# Patient Record
Sex: Male | Born: 1980 | Race: White | Hispanic: No | Marital: Single | Smoking: Current every day smoker
Health system: Southern US, Community
[De-identification: ages and names within clinical notes are randomized; demographics above are authoritative.]

## PROBLEM LIST (undated history)

## (undated) DIAGNOSIS — M1990 Unspecified osteoarthritis, unspecified site: Secondary | ICD-10-CM

---

## 2010-09-25 ENCOUNTER — Emergency Department: Payer: Self-pay | Admitting: Emergency Medicine

## 2018-12-02 ENCOUNTER — Other Ambulatory Visit: Payer: Self-pay

## 2018-12-02 ENCOUNTER — Encounter (HOSPITAL_COMMUNITY): Payer: Self-pay | Admitting: Emergency Medicine

## 2018-12-02 ENCOUNTER — Emergency Department (HOSPITAL_COMMUNITY)
Admission: EM | Admit: 2018-12-02 | Discharge: 2018-12-02 | Disposition: A | Discharge status: Home / Self Care | Payer: 59 | Attending: Emergency Medicine | Admitting: Emergency Medicine

## 2018-12-02 DIAGNOSIS — R197 Diarrhea, unspecified: Secondary | ICD-10-CM | POA: Insufficient documentation

## 2018-12-02 DIAGNOSIS — F17210 Nicotine dependence, cigarettes, uncomplicated: Secondary | ICD-10-CM | POA: Insufficient documentation

## 2018-12-02 DIAGNOSIS — R6883 Chills (without fever): Secondary | ICD-10-CM | POA: Insufficient documentation

## 2018-12-02 DIAGNOSIS — R05 Cough: Secondary | ICD-10-CM | POA: Diagnosis not present

## 2018-12-02 DIAGNOSIS — R6889 Other general symptoms and signs: Secondary | ICD-10-CM

## 2018-12-02 HISTORY — DX: Unspecified osteoarthritis, unspecified site: M19.90

## 2018-12-02 LAB — INFLUENZA PANEL BY PCR (TYPE A & B)
Influenza A By PCR: POSITIVE — AB
Influenza B By PCR: NEGATIVE

## 2018-12-02 NOTE — Discharge Instructions (Addendum)
Follow up viral testing more so for your knowledge and if positive unlikely COVID. Take tylenol every 4 hrs for pain and fevers.   If flu negative follow below.    Person Under Monitoring Name: Edwin Tucker  Location: [address] [place] [address]   Infection Prevention Recommendations for Individuals Confirmed to have, or Being Evaluated for, 2019 Novel Coronavirus (COVID-19) Infection Who Receive Care at Home  Individuals who are confirmed to have, or are being evaluated for, COVID-19 should follow the prevention steps below until a healthcare provider or local or state health department says they can return to normal activities.  Stay home except to get medical care You should restrict activities outside your home, except for getting medical care. Do not go to work, school, or public areas, and do not use public transportation or taxis.  Call ahead before visiting your doctor Before your medical appointment, call the healthcare provider and tell them that you have, or are being evaluated for, COVID-19 infection. This will help the healthcare providers office take steps to keep other people from getting infected. Ask your healthcare provider to call the local or state health department.  Monitor your symptoms Seek prompt medical attention if your illness is worsening (e.g., difficulty breathing). Before going to your medical appointment, call the healthcare provider and tell them that you have, or are being evaluated for, COVID-19 infection. Ask your healthcare provider to call the local or state health department.  Wear a facemask You should wear a facemask that covers your nose and mouth when you are in the same room with other people and when you visit a healthcare provider. People who live with or visit you should also wear a facemask while they are in the same room with you.  Separate yourself from other people in your home As much as possible, you  should stay in a different room from other people in your home. Also, you should use a separate bathroom, if available.  Avoid sharing household items You should not share dishes, drinking glasses, cups, eating utensils, towels, bedding, or other items with other people in your home. After using these items, you should wash them thoroughly with soap and water.  Cover your coughs and sneezes Cover your mouth and nose with a tissue when you cough or sneeze, or you can cough or sneeze into your sleeve. Throw used tissues in a lined trash can, and immediately wash your hands with soap and water for at least 20 seconds or use an alcohol-based hand rub.  Wash your [employer] your hands often and thoroughly with soap and water for at least 20 seconds. You can use an alcohol-based hand sanitizer if soap and water are not available and if your hands are not visibly dirty. Avoid touching your eyes, nose, and mouth with unwashed hands.   Prevention Steps for Caregivers and Household Members of Individuals Confirmed to have, or Being Evaluated for, COVID-19 Infection Being Cared for in the Home  If you live with, or provide care at home for, a person confirmed to have, or being evaluated for, COVID-19 infection please follow these guidelines to prevent infection:  Follow healthcare providers instructions Make sure that you understand and can help the patient follow any healthcare provider instructions for all care.  Provide for the patients basic needs You should help the patient with basic needs in the home and provide support for getting groceries, prescriptions, and other personal needs.  Monitor the patients symptoms If  they are getting sicker, call his or her medical provider and tell them that the patient has, or is being evaluated for, COVID-19 infection. This will help the healthcare providers office take steps to keep other people from getting infected. Ask the healthcare provider  to call the local or state health department.  Limit the number of people who have contact with the patient If possible, have only one caregiver for the patient. Other household members should stay in another home or place of residence. If this is not possible, they should stay in another room, or be separated from the patient as much as possible. Use a separate bathroom, if available. Restrict visitors who do not have an essential need to be in the home.  Keep older adults, very young children, and other sick people away from the patient Keep older adults, very young children, and those who have compromised immune systems or chronic health conditions away from the patient. This includes people with chronic heart, lung, or kidney conditions, diabetes, and cancer.  Ensure good ventilation Make sure that shared spaces in the home have good air flow, such as from an air conditioner or an opened window, weather permitting.  Wash your hands often Wash your hands often and thoroughly with soap and water for at least 20 seconds. You can use an alcohol based hand sanitizer if soap and water are not available and if your hands are not visibly dirty. Avoid touching your eyes, nose, and mouth with unwashed hands. Use disposable paper towels to dry your hands. If not available, use dedicated cloth towels and replace them when they become wet.  Wear a facemask and gloves Wear a disposable facemask at all times in the room and gloves when you touch or have contact with the patients blood, body fluids, and/or secretions or excretions, such as sweat, saliva, sputum, nasal mucus, vomit, urine, or feces.  Ensure the mask fits over your nose and mouth tightly, and do not touch it during use. Throw out disposable facemasks and gloves after using them. Do not reuse. Wash your hands immediately after removing your facemask and gloves. If your personal clothing becomes contaminated, carefully remove clothing and  launder. Wash your hands after handling contaminated clothing. Place all used disposable facemasks, gloves, and other waste in a lined container before disposing them with other household waste. Remove gloves and wash your hands immediately after handling these items.  Do not share dishes, glasses, or other household items with the patient Avoid sharing household items. You should not share dishes, drinking glasses, cups, eating utensils, towels, bedding, or other items with a patient who is confirmed to have, or being evaluated for, COVID-19 infection. After the person uses these items, you should wash them thoroughly with soap and water.  Wash laundry thoroughly Immediately remove and wash clothes or bedding that have blood, body fluids, and/or secretions or excretions, such as sweat, saliva, sputum, nasal mucus, vomit, urine, or feces, on them. Wear gloves when handling laundry from the patient. Read and follow directions on labels of laundry or clothing items and detergent. In general, wash and dry with the warmest temperatures recommended on the label.  Clean all areas the individual has used often Clean all touchable surfaces, such as counters, tabletops, doorknobs, bathroom fixtures, toilets, phones, keyboards, tablets, and bedside tables, every day. Also, clean any surfaces that may have blood, body fluids, and/or secretions or excretions on them. Wear gloves when cleaning surfaces the patient has come in contact with. Use  a diluted bleach solution (e.g., dilute bleach with 1 part bleach and 10 parts water) or a household disinfectant with a label that says EPA-registered for coronaviruses. To make a bleach solution at home, add 1 tablespoon of bleach to 1 quart (4 cups) of water. For a larger supply, add  cup of bleach to 1 gallon (16 cups) of water. Read labels of cleaning products and follow recommendations provided on product labels. Labels contain instructions for safe and effective  use of the cleaning product including precautions you should take when applying the product, such as wearing gloves or eye protection and making sure you have good ventilation during use of the product. Remove gloves and wash hands immediately after cleaning.  Monitor yourself for signs and symptoms of illness Caregivers and household members are considered close contacts, should monitor their health, and will be asked to limit movement outside of the home to the extent possible. Follow the monitoring steps for close contacts listed on the symptom monitoring form.   ? If you have additional questions, contact your local health department or call the epidemiologist on call at [phone] (available 24/7). ? This guidance is subject to change. For the most up-to-date guidance from [facility], please refer to their website: [email or web address]

## 2018-12-02 NOTE — ED Provider Notes (Addendum)
[facility] EMERGENCY DEPARTMENT Provider Note   CSN: [id number] Arrival date & time: 12/02/18  1036    History   Chief Complaint Chief Complaint  Patient presents with  . Cough  . Chills    HPI Edwin Tucker is a 38 y.o. male.     Patient with no significant medical history, cigarette smoker presents with cough, congestion patient did not is exposed to airport [place].  No known contacts.  No other trauma.  No lung disease.  No sick contacts at home.     Past Medical History:  Diagnosis Date  . Arthritis     There are no active problems to display for this patient.   History reviewed. No pertinent surgical history.      Home Medications    Prior to Admission medications   Not on File    Family History No family history on file.  Social History Social History   Tobacco Use  . Smoking status: Current Every Day Smoker    Packs/day: 1.50    Types: Cigarettes  . Smokeless tobacco: Never Used  Substance Use Topics  . Alcohol use: Yes  . Drug use: Yes    Types: Marijuana     Allergies   Patient has no known allergies.   Review of Systems Review of Systems  Constitutional: Positive for chills. Negative for fever.  HENT: Positive for congestion.   Eyes: Negative for visual disturbance.  Respiratory: Positive for cough. Negative for shortness of breath.   Cardiovascular: Negative for chest pain.  Gastrointestinal: Negative for abdominal pain and vomiting.  Genitourinary: Negative for dysuria and flank pain.  Musculoskeletal: Negative for back pain, neck pain and neck stiffness.  Skin: Negative for rash.  Neurological: Negative for light-headedness and headaches.     Physical Exam Updated Vital Signs BP (!) 134/97 (BP Location: Right Arm)   Pulse 90   Temp 98.6 F (37 C) (Oral)   Resp 16   Ht 5\' 10"  (1.778 m)   Wt 74.8 kg   SpO2 99%   BMI 23.68 kg/m   Physical Exam Vitals signs and nursing note  reviewed.  Constitutional:      Appearance: He is well-developed.  HENT:     Head: Normocephalic and atraumatic.     Nose: Congestion present.  Eyes:     General:        Right eye: No discharge.        Left eye: No discharge.     Conjunctiva/sclera: Conjunctivae normal.  Neck:     Musculoskeletal: Normal range of motion and neck supple.     Trachea: No tracheal deviation.  Cardiovascular:     Rate and Rhythm: Normal rate and regular rhythm.  Pulmonary:     Effort: Pulmonary effort is normal.     Breath sounds: Normal breath sounds.  Abdominal:     General: There is no distension.     Palpations: Abdomen is soft.  Skin:    General: Skin is warm.     Findings: No rash.  Neurological:     Mental Status: He is alert and oriented to person, place, and time.      ED Treatments / Results  Labs (all labs ordered are listed, but only abnormal results are displayed) Labs Reviewed  INFLUENZA PANEL BY PCR (TYPE A & B)    EKG None  Radiology No results found.  Procedures Procedures (including critical care time)  Medications Ordered in ED Medications -  No data to display   Initial Impression / Assessment and Plan / ED Course  I have reviewed the triage vital signs and the nursing notes.  Pertinent labs & imaging results that were available during my care of the patient were reviewed by me and considered in my medical decision making (see chart for details).       Patient presents with flulike/respiratory symptoms.  Lungs are clear, normal work of breathing.  Patient is low risk for covid.  Discussed supportive care and reasons to return.  Discussed isolation until follow-up of flu testing or no fever for 3 days.  Work note given for 1 week.  Flu test sent, no indication for Tamiflu to support another diagnosis other than COVID  Final Clinical Impressions(s) / ED Diagnoses   Final diagnoses:  Flu-like symptoms    ED Discharge Orders    None       Blane Ohara, MD 12/02/18 1235    Blane Ohara, MD 12/02/18 1236

## 2018-12-02 NOTE — ED Notes (Signed)
Pt verbalized understanding of discharge instructions and denies any further questions at this time.   Pt given instructions for follow up, he understands and gives his signature at discharge after reading all information provided in the packet. He will make a call or use mychart for results.

## 2018-12-02 NOTE — ED Notes (Signed)
ED Provider at bedside. 

## 2018-12-02 NOTE — ED Triage Notes (Addendum)
Pt reports cough, chills and diarrhea x3-4 days since Friday 3/20. Reports he went to [facility] last weekend returned to [facility] 3/15. Unsure if he has been in contact with anyone.

## 2018-12-03 ENCOUNTER — Telehealth (HOSPITAL_COMMUNITY): Payer: Self-pay

## 2018-12-03 ENCOUNTER — Telehealth: Payer: Self-pay | Admitting: *Deleted

## 2018-12-03 NOTE — Telephone Encounter (Signed)
Pt called regarding positive flu test.  [facility] will contact EDP for Rx after pt returns call regarding pharmacy choice.  Ishaan Villamar J. Lucretia Roers, RN, BSN, [place] [phone]

## 2018-12-03 NOTE — Telephone Encounter (Signed)
EDCM spoke with EDP regarding Rx for pt.  EDP states pt is outside of tx window and Rx would not benefit pt.  EDCM relayed information to pt.  Pt voiced a little disappointment and stated "maybe I took good  care of myself!"  [facility] acknowledged pt feelings. No further EDCM needs identified.  Devonta Blanford J. Lucretia Roers, RN, BSN, [place] [phone]

## 2020-12-21 ENCOUNTER — Emergency Department: Payer: 59

## 2020-12-21 ENCOUNTER — Emergency Department
Admission: EM | Admit: 2020-12-21 | Discharge: 2020-12-21 | Payer: 59 | Attending: Emergency Medicine | Admitting: Emergency Medicine

## 2020-12-21 ENCOUNTER — Encounter: Payer: Self-pay | Admitting: *Deleted

## 2020-12-21 ENCOUNTER — Other Ambulatory Visit: Payer: Self-pay

## 2020-12-21 DIAGNOSIS — Z23 Encounter for immunization: Secondary | ICD-10-CM | POA: Insufficient documentation

## 2020-12-21 DIAGNOSIS — F17210 Nicotine dependence, cigarettes, uncomplicated: Secondary | ICD-10-CM | POA: Diagnosis not present

## 2020-12-21 DIAGNOSIS — Y92410 Unspecified street and highway as the place of occurrence of the external cause: Secondary | ICD-10-CM | POA: Diagnosis not present

## 2020-12-21 DIAGNOSIS — S0181XA Laceration without foreign body of other part of head, initial encounter: Secondary | ICD-10-CM

## 2020-12-21 DIAGNOSIS — S51812A Laceration without foreign body of left forearm, initial encounter: Secondary | ICD-10-CM | POA: Diagnosis not present

## 2020-12-21 DIAGNOSIS — F10129 Alcohol abuse with intoxication, unspecified: Secondary | ICD-10-CM | POA: Diagnosis not present

## 2020-12-21 DIAGNOSIS — F10920 Alcohol use, unspecified with intoxication, uncomplicated: Secondary | ICD-10-CM

## 2020-12-21 DIAGNOSIS — S022XXA Fracture of nasal bones, initial encounter for closed fracture: Secondary | ICD-10-CM | POA: Diagnosis not present

## 2020-12-21 DIAGNOSIS — S01111A Laceration without foreign body of right eyelid and periocular area, initial encounter: Secondary | ICD-10-CM | POA: Diagnosis not present

## 2020-12-21 DIAGNOSIS — S0992XA Unspecified injury of nose, initial encounter: Secondary | ICD-10-CM | POA: Diagnosis present

## 2020-12-21 LAB — CBC WITH DIFFERENTIAL/PLATELET
Abs Immature Granulocytes: 0.02 10*3/uL (ref 0.00–0.07)
Basophils Absolute: 0 10*3/uL (ref 0.0–0.1)
Basophils Relative: 1 %
Eosinophils Absolute: 0.2 10*3/uL (ref 0.0–0.5)
Eosinophils Relative: 2 %
HCT: 43.9 % (ref 39.0–52.0)
Hemoglobin: 15.8 g/dL (ref 13.0–17.0)
Immature Granulocytes: 0 %
Lymphocytes Relative: 32 %
Lymphs Abs: 2.5 10*3/uL (ref 0.7–4.0)
MCH: 33 pg (ref 26.0–34.0)
MCHC: 36 g/dL (ref 30.0–36.0)
MCV: 91.6 fL (ref 80.0–100.0)
Monocytes Absolute: 0.8 10*3/uL (ref 0.1–1.0)
Monocytes Relative: 11 %
Neutro Abs: 4.2 10*3/uL (ref 1.7–7.7)
Neutrophils Relative %: 54 %
Platelets: 194 10*3/uL (ref 150–400)
RBC: 4.79 MIL/uL (ref 4.22–5.81)
RDW: 13.6 % (ref 11.5–15.5)
WBC: 7.7 10*3/uL (ref 4.0–10.5)
nRBC: 0 % (ref 0.0–0.2)

## 2020-12-21 LAB — COMPREHENSIVE METABOLIC PANEL
ALT: 34 U/L (ref 0–44)
AST: 34 U/L (ref 15–41)
Albumin: 4.3 g/dL (ref 3.5–5.0)
Alkaline Phosphatase: 67 U/L (ref 38–126)
Anion gap: 11 (ref 5–15)
BUN: 14 mg/dL (ref 6–20)
CO2: 20 mmol/L — ABNORMAL LOW (ref 22–32)
Calcium: 8.8 mg/dL — ABNORMAL LOW (ref 8.9–10.3)
Chloride: 104 mmol/L (ref 98–111)
Creatinine, Ser: 0.84 mg/dL (ref 0.61–1.24)
GFR, Estimated: >60 mL/min (ref >60)
Glucose, Bld: 96 mg/dL (ref 70–99)
Potassium: 4.2 mmol/L (ref 3.5–5.1)
Sodium: 135 mmol/L (ref 135–145)
Total Bilirubin: 1.2 mg/dL (ref 0.3–1.2)
Total Protein: 7.1 g/dL (ref 6.5–8.1)

## 2020-12-21 LAB — ETHANOL: Alcohol, Ethyl (B): 126 mg/dL — ABNORMAL HIGH

## 2020-12-21 MED ORDER — BACITRACIN ZINC 500 UNIT/GM EX OINT
TOPICAL_OINTMENT | Freq: Once | CUTANEOUS | Status: AC
  Filled 2020-12-21: qty 0.9

## 2020-12-21 MED ORDER — LIDOCAINE HCL (PF) 1 % IJ SOLN
30.0000 mL | Freq: Once | INTRAMUSCULAR | Status: AC
  Administered 2020-12-21: 30 mL
  Filled 2020-12-21: qty 30

## 2020-12-21 MED ORDER — SODIUM CHLORIDE 0.9 % IV BOLUS
1000.0000 mL | Freq: Once | INTRAVENOUS | Status: AC
  Administered 2020-12-21: 1000 mL via INTRAVENOUS

## 2020-12-21 MED ORDER — TETANUS-DIPHTH-ACELL PERTUSSIS 5-2.5-18.5 LF-MCG/0.5 IM SUSY
0.5000 mL | PREFILLED_SYRINGE | Freq: Once | INTRAMUSCULAR | Status: AC
  Administered 2020-12-21: 0.5 mL via INTRAMUSCULAR
  Filled 2020-12-21: qty 0.5

## 2020-12-21 MED ORDER — MORPHINE SULFATE (PF) 4 MG/ML IV SOLN
4.0000 mg | Freq: Once | INTRAVENOUS | Status: AC
  Administered 2020-12-21: 4 mg via INTRAVENOUS
  Filled 2020-12-21: qty 1

## 2020-12-21 MED ORDER — CEFAZOLIN SODIUM-DEXTROSE 1-4 GM/50ML-% IV SOLN
1.0000 g | Freq: Once | INTRAVENOUS | Status: AC
  Administered 2020-12-21: 1 g via INTRAVENOUS
  Filled 2020-12-21: qty 50

## 2020-12-21 MED ORDER — ONDANSETRON HCL 4 MG/2ML IJ SOLN
4.0000 mg | Freq: Once | INTRAMUSCULAR | Status: AC
  Administered 2020-12-21: 4 mg via INTRAVENOUS
  Filled 2020-12-21: qty 2

## 2020-12-21 MED ORDER — LIDOCAINE HCL (PF) 1 % IJ SOLN
INTRAMUSCULAR | Status: AC
  Filled 2020-12-21: qty 5
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
## 2020-12-21 NOTE — ED Notes (Signed)
Pt denies blurred vision eye pain.

## 2020-12-21 NOTE — ED Notes (Signed)
Report given to [facility] from [place], Dorene Sorrow from [employer]

## 2020-12-21 NOTE — ED Notes (Signed)
Left forearm, cleaned, and bandaged.
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
## 2020-12-21 NOTE — ED Notes (Signed)
Report off to theresa rn
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
## 2020-12-21 NOTE — ED Notes (Signed)
Pt awake and alert.   Family with pt  

## 2020-12-21 NOTE — ED Notes (Signed)
Pt refuses covid swab. 

## 2020-12-21 NOTE — ED Notes (Signed)
Pt in ct scan  

## 2020-12-21 NOTE — ED Notes (Signed)
Family with pt
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
